# Patient Record
Sex: Female | Born: 1950 | Race: White | Hispanic: No | Marital: Married | Smoking: Former smoker
Health system: Southern US, Community
[De-identification: ages and names within clinical notes are randomized; demographics above are authoritative.]

## PROBLEM LIST (undated history)

## (undated) DIAGNOSIS — N921 Excessive and frequent menstruation with irregular cycle: Secondary | ICD-10-CM

## (undated) DIAGNOSIS — F32A Depression, unspecified: Secondary | ICD-10-CM

## (undated) DIAGNOSIS — I10 Essential (primary) hypertension: Secondary | ICD-10-CM

## (undated) DIAGNOSIS — N029 Recurrent and persistent hematuria with unspecified morphologic changes: Secondary | ICD-10-CM

## (undated) DIAGNOSIS — F329 Major depressive disorder, single episode, unspecified: Secondary | ICD-10-CM

## (undated) HISTORY — DX: Essential (primary) hypertension: I10

## (undated) HISTORY — DX: Excessive and frequent menstruation with irregular cycle: N92.1

## (undated) HISTORY — DX: Major depressive disorder, single episode, unspecified: F32.9

## (undated) HISTORY — DX: Recurrent and persistent hematuria with unspecified morphologic changes: N02.9

## (undated) HISTORY — DX: Depression, unspecified: F32.A

---

## 1995-12-30 HISTORY — PX: TUBAL LIGATION: SHX77

## 1996-01-30 HISTORY — PX: HYSTEROSCOPY: SHX211

## 1996-03-13 HISTORY — PX: TOTAL ABDOMINAL HYSTERECTOMY W/ BILATERAL SALPINGOOPHORECTOMY: SHX83

## 1997-07-26 ENCOUNTER — Ambulatory Visit (HOSPITAL_COMMUNITY): Admission: RE | Admit: 1997-07-26 | Discharge: 1997-07-26 | Payer: Self-pay | Admitting: Obstetrics and Gynecology

## 1997-07-29 DIAGNOSIS — N029 Recurrent and persistent hematuria with unspecified morphologic changes: Secondary | ICD-10-CM

## 1997-07-29 HISTORY — DX: Recurrent and persistent hematuria with unspecified morphologic changes: N02.9

## 1998-09-22 ENCOUNTER — Ambulatory Visit (HOSPITAL_COMMUNITY): Admission: RE | Admit: 1998-09-22 | Discharge: 1998-09-22 | Payer: Self-pay | Admitting: Obstetrics and Gynecology

## 1998-09-22 ENCOUNTER — Encounter: Payer: Self-pay | Admitting: Obstetrics and Gynecology

## 1999-11-11 ENCOUNTER — Ambulatory Visit (HOSPITAL_COMMUNITY): Admission: RE | Admit: 1999-11-11 | Discharge: 1999-11-11 | Payer: Self-pay | Admitting: Obstetrics and Gynecology

## 1999-11-11 ENCOUNTER — Encounter: Payer: Self-pay | Admitting: Obstetrics and Gynecology

## 2001-05-11 ENCOUNTER — Ambulatory Visit (HOSPITAL_COMMUNITY): Admission: RE | Admit: 2001-05-11 | Discharge: 2001-05-11 | Payer: Self-pay | Admitting: Obstetrics and Gynecology

## 2001-05-11 ENCOUNTER — Encounter: Payer: Self-pay | Admitting: Obstetrics and Gynecology

## 2001-11-30 ENCOUNTER — Other Ambulatory Visit: Admission: RE | Admit: 2001-11-30 | Discharge: 2001-11-30 | Payer: Self-pay | Admitting: *Deleted

## 2002-06-20 ENCOUNTER — Ambulatory Visit (HOSPITAL_COMMUNITY): Admission: RE | Admit: 2002-06-20 | Discharge: 2002-06-20 | Payer: Self-pay | Admitting: Obstetrics and Gynecology

## 2002-06-20 ENCOUNTER — Encounter: Payer: Self-pay | Admitting: Obstetrics and Gynecology

## 2002-12-11 ENCOUNTER — Other Ambulatory Visit: Admission: RE | Admit: 2002-12-11 | Discharge: 2002-12-11 | Payer: Self-pay | Admitting: Obstetrics and Gynecology

## 2003-06-26 ENCOUNTER — Ambulatory Visit (HOSPITAL_COMMUNITY): Admission: RE | Admit: 2003-06-26 | Discharge: 2003-06-26 | Payer: Self-pay | Admitting: Obstetrics and Gynecology

## 2004-04-14 ENCOUNTER — Ambulatory Visit (HOSPITAL_COMMUNITY): Admission: RE | Admit: 2004-04-14 | Discharge: 2004-04-14 | Payer: Self-pay | Admitting: Gastroenterology

## 2004-04-14 ENCOUNTER — Encounter (INDEPENDENT_AMBULATORY_CARE_PROVIDER_SITE_OTHER): Payer: Self-pay | Admitting: *Deleted

## 2004-08-04 ENCOUNTER — Ambulatory Visit (HOSPITAL_COMMUNITY): Admission: RE | Admit: 2004-08-04 | Discharge: 2004-08-04 | Payer: Self-pay | Admitting: Obstetrics and Gynecology

## 2005-09-15 ENCOUNTER — Ambulatory Visit (HOSPITAL_COMMUNITY): Admission: RE | Admit: 2005-09-15 | Discharge: 2005-09-15 | Payer: Self-pay | Admitting: Obstetrics and Gynecology

## 2006-09-19 ENCOUNTER — Ambulatory Visit (HOSPITAL_COMMUNITY): Admission: RE | Admit: 2006-09-19 | Discharge: 2006-09-19 | Payer: Self-pay | Admitting: Obstetrics & Gynecology

## 2007-09-20 ENCOUNTER — Ambulatory Visit (HOSPITAL_COMMUNITY): Admission: RE | Admit: 2007-09-20 | Discharge: 2007-09-20 | Payer: Self-pay | Admitting: Obstetrics and Gynecology

## 2008-09-24 ENCOUNTER — Ambulatory Visit (HOSPITAL_COMMUNITY): Admission: RE | Admit: 2008-09-24 | Discharge: 2008-09-24 | Payer: Self-pay | Admitting: Obstetrics & Gynecology

## 2009-09-25 ENCOUNTER — Ambulatory Visit (HOSPITAL_COMMUNITY): Admission: RE | Admit: 2009-09-25 | Discharge: 2009-09-25 | Payer: Self-pay | Admitting: Obstetrics and Gynecology

## 2010-09-17 ENCOUNTER — Other Ambulatory Visit (HOSPITAL_COMMUNITY): Payer: Self-pay | Admitting: Occupational Therapy

## 2010-09-17 DIAGNOSIS — Z1231 Encounter for screening mammogram for malignant neoplasm of breast: Secondary | ICD-10-CM

## 2010-09-29 ENCOUNTER — Ambulatory Visit (HOSPITAL_COMMUNITY)
Admission: RE | Admit: 2010-09-29 | Discharge: 2010-09-29 | Disposition: A | Discharge status: Home / Self Care | Payer: 59 | Source: Ambulatory Visit | Attending: Nurse Practitioner | Admitting: Nurse Practitioner

## 2010-09-29 DIAGNOSIS — Z1231 Encounter for screening mammogram for malignant neoplasm of breast: Secondary | ICD-10-CM

## 2010-10-09 ENCOUNTER — Other Ambulatory Visit (HOSPITAL_COMMUNITY): Payer: Self-pay | Admitting: Family Medicine

## 2010-10-09 ENCOUNTER — Other Ambulatory Visit (HOSPITAL_COMMUNITY): Payer: Self-pay | Admitting: Occupational Therapy

## 2010-10-09 ENCOUNTER — Other Ambulatory Visit: Payer: Self-pay | Admitting: Nurse Practitioner

## 2010-10-09 DIAGNOSIS — Z1231 Encounter for screening mammogram for malignant neoplasm of breast: Secondary | ICD-10-CM

## 2010-10-16 NOTE — Op Note (Signed)
Pam May, Pam May                 ACCOUNT NO.:  [id number]   MEDICAL RECORD NO.:  [id number]          PATIENT TYPE:  AMB   LOCATION:  ENDO                         FACILITY:  [facility]   PHYSICIAN:  Jordan Hawks. Elnoria Howard, MD    DATE OF BIRTH:  [birth year]   DATE OF PROCEDURE:  04/14/2004  DATE OF DISCHARGE:                                 OPERATIVE REPORT   REFERRING PHYSICIAN:  Dr. Myrlene Broker.   PROCEDURE PERFORMED:  Colonoscopy.   INDICATIONS FOR PROCEDURE:  Screening colonoscopy.   ENDOSCOPIST:  Jordan Hawks. Elnoria Howard, MD   INSTRUMENT USED:  Olympus adult colonoscope.   PHYSICAL EXAMINATION:  CARDIAC:  Regular rate and rhythm.  LUNGS:  Clear to auscultation bilaterally.  ABDOMEN:  Soft, nontender, nondistended.   MEDICATIONS GIVEN:  Versed 10 mg IV, Demerol 100 mg IV.   CONSENT:  Informed consent was obtained from the patient describing the  risks of bleeding, infection, perforation, medication reaction, a 10% miss  rate for a small colon cancer or polyp and the risk of death, all of which  are not exclusive of any other complications that can occur.   DESCRIPTION OF PROCEDURE:  After adequate sedation was achieved, a rectal  examination was performed which was negative for any palpable abnormalities.  The colonoscope was then inserted into the anus and advanced under direct  visualization to the terminal ileum without difficulty.  Photodocumentation  of the terminal ileum and the cecum was obtained.  Upon slow withdrawal of  the colonoscope, the patient was noted to have a very good prep.  There was  no evidence of any polyps, inflammation, ulcerations or erosions or vascular  abnormalities in the cecum, ascending, transverse or descending colon.  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 Additionally, in the sigmoid colon, two 5 mm polyps were  detected and removed with a cold snare without complication.  Upon  retroflexion in the rectum,  there was no evidence of any hemorrhoids or  other abnormalities.  The colonoscope was straightened and withdrawn from  the patient.  The procedure was terminated and the patient tolerated the  procedure well.  No complications were encountered.   PLAN:  1.  Follow-up on biopsies of the polyps.  2.  Repeat colonoscopy in five years.  3.  Maintain a high fiber diet.       PDH/MEDQ  D:  04/14/2004  T:  04/14/2004  Job:  [id number]

## 2011-09-15 ENCOUNTER — Other Ambulatory Visit: Payer: Self-pay | Admitting: Nurse Practitioner

## 2011-09-15 DIAGNOSIS — Z1231 Encounter for screening mammogram for malignant neoplasm of breast: Secondary | ICD-10-CM

## 2011-10-08 ENCOUNTER — Ambulatory Visit (HOSPITAL_COMMUNITY)
Admission: RE | Admit: 2011-10-08 | Discharge: 2011-10-08 | Disposition: A | Discharge status: Home / Self Care | Payer: 59 | Source: Ambulatory Visit | Attending: Nurse Practitioner | Admitting: Nurse Practitioner

## 2011-10-08 DIAGNOSIS — Z1231 Encounter for screening mammogram for malignant neoplasm of breast: Secondary | ICD-10-CM | POA: Insufficient documentation

## 2012-09-05 ENCOUNTER — Encounter: Payer: Self-pay | Admitting: Nurse Practitioner

## 2012-09-05 ENCOUNTER — Ambulatory Visit (INDEPENDENT_AMBULATORY_CARE_PROVIDER_SITE_OTHER): Payer: 59 | Admitting: Nurse Practitioner

## 2012-09-05 VITALS — BP 120/80 | HR 68 | Resp 18 | Ht 65.0 in | Wt 229.0 lb

## 2012-09-05 DIAGNOSIS — Z01419 Encounter for gynecological examination (general) (routine) without abnormal findings: Secondary | ICD-10-CM

## 2012-09-05 DIAGNOSIS — Z0000 Encounter for general adult medical examination without abnormal findings: Secondary | ICD-10-CM

## 2012-09-05 DIAGNOSIS — E559 Vitamin D deficiency, unspecified: Secondary | ICD-10-CM

## 2012-09-05 LAB — POCT URINALYSIS DIPSTICK
Blood, UA: NEGATIVE
Glucose, UA: NEGATIVE
Nitrite, UA: NEGATIVE
Protein, UA: NEGATIVE

## 2012-09-05 MED ORDER — HYDROCHLOROTHIAZIDE 25 MG PO TABS: 25.0000 mg | ORAL_TABLET | Freq: Every day | ORAL | Status: DC

## 2012-09-05 MED ORDER — ESTROGENS CONJUGATED 0.3 MG PO TABS: 0.3000 mg | ORAL_TABLET | Freq: Every day | ORAL | Status: DC

## 2012-09-05 MED ORDER — VITAMIN D (ERGOCALCIFEROL) 1.25 MG (50000 UNIT) PO CAPS: 50000.0000 [IU] | ORAL_CAPSULE | ORAL | Status: DC

## 2012-09-05 NOTE — Progress Notes (Signed)
62 y.o. G0P0000 Married Caucasian Fe here for annual exam.  No recent health issues. No vaso symptoms.  Some vaginal dryness and uses OTC lubrication.  She needs a refill on HCTZ until sees PCP this spring. (currently off med's).  No LMP recorded. Patient has had a hysterectomy.          Sexually active: yes  The current method of family planning is none.    Exercising: no  The patient does not participate in regular exercise at present. Smoker:  no  Health Maintenance: Pap:  12/11/02 MMG: 5/13 normal, to be scheduled after 10/07/12 Colonoscopy: 11/05 will be due in 2015 BMD:   04/09   normal TDaP: 05/17/08 Labs:Hgb, UA, Vitamin D   reports that she quit smoking about 16 years ago. She has never used smokeless tobacco. She reports that she drinks about 1.0 ounces of alcohol per week. She reports that she does not use illicit drugs.  Past Medical History  Diagnosis Date  . Hypertension   . Depression   . Menometrorrhagia     Past Surgical History  Procedure Laterality Date  . Abdominal hysterectomy  1992    Current Outpatient Prescriptions  Medication Sig Dispense Refill  . Calcium Carbonate-Vit D-Min (CALCIUM 1200 PO) Take by mouth.      . estrogens, conjugated, (PREMARIN) 0.45 MG tablet Take 0.45 mg by mouth daily. Take daily for 21 days then do not take for 7 days.      . hydrochlorothiazide (HYDRODIURIL) 25 MG tablet Take 25 mg by mouth daily.      . Multiple Vitamin (MULTIVITAMIN) capsule Take 1 capsule by mouth daily.      . Vitamin D, Ergocalciferol, (DRISDOL) 50000 UNITS CAPS Take 50,000 Units by mouth.       No current facility-administered medications for this visit.    Family History  Problem Relation Age of Onset  . Diabetes Father   . Hypertension Father     ROS:  Pertinent items are noted in HPI.  Otherwise, a comprehensive ROS was negative.  Exam:   BP 120/80  Pulse 68  Resp 18  Ht 5\' 5"  (1.651 m)  Wt 229 lb (103.874 kg)  BMI 38.11 kg/m2    Height:  5\' 5"  (165.1 cm)  Ht Readings from Last 3 Encounters:  09/05/12 5\' 5"  (1.651 m)    General appearance: alert, cooperative and appears stated age Head: Normocephalic, without obvious abnormality, atraumatic Neck: no adenopathy, supple, symmetrical, trachea midline and thyroid normal to inspection and palpation Lungs: clear to auscultation bilaterally Breasts: normal appearance, no masses or tenderness Heart: regular rate and rhythm Abdomen: soft, non-tender;  no masses,  no organomegaly Extremities: extremities normal, atraumatic, no cyanosis or edema Skin: Skin color, texture, turgor normal. No rashes or lesions Lymph nodes: Cervical, supraclavicular, and axillary nodes normal. No abnormal inguinal nodes palpated Neurologic: Grossly normal   Pelvic: External genitalia:  no lesions              Urethra:  normal appearing urethra with no masses, tenderness or lesions              Bartholin's and Skene's: normal                 Vagina: normal appearing vagina with atrophic color and no discharge, no lesions              Cervix: absent              Pap taken:  no Bimanual Exam:  Uterus:  uterus absent              Adnexa: no mass, fullness, tenderness               Rectovaginal: Confirms               Anus:  normal sphincter tone, no lesions  A:  Well Woman with normal exam  S/P TAH / BSO 10/97 secondary to menometrorrhagia  History of HTN  History of Vit D def  P:   Mammogram  Discussed OTC vit D vs RX - will depend on lab  results.  Return annually or prn  An After Visit Summary was printed and given to the patient.

## 2012-09-05 NOTE — Patient Instructions (Addendum)

## 2012-09-06 NOTE — Progress Notes (Signed)
Encounter reviewed by Dr. Brook Silva.  

## 2012-09-08 ENCOUNTER — Telehealth: Payer: Self-pay | Admitting: Nurse Practitioner

## 2012-09-08 NOTE — Telephone Encounter (Signed)
Pt is still waiting on someone to call her back with results

## 2012-09-08 NOTE — Telephone Encounter (Signed)
Results given 09/08/12 bp

## 2012-09-11 NOTE — Telephone Encounter (Signed)
Please call work [phone]

## 2012-09-14 ENCOUNTER — Encounter: Payer: Self-pay | Admitting: Nurse Practitioner

## 2012-09-21 NOTE — Telephone Encounter (Signed)
Patient called due to her frustration in delay getting a response from [place].  States she only sees Pam May each year so hs always had routine screening labs and is questioning what was different this year?  Why did we not check all the labs we have checked in past.  Advised that U/A and HGB are our  "routine" screening labs and that all other screening tests are ordered by the provider during the visit based on patient history and needs.  Her cholesterol has been excellent as far back as 2005.  Offered to allow patient to come back for additional screening labs and she declines but does want to make sure Pam May knows that she will want additional lab work next year.  Apologized for delay in responding to patient.

## 2012-11-01 ENCOUNTER — Other Ambulatory Visit: Payer: Self-pay | Admitting: Nurse Practitioner

## 2012-11-01 DIAGNOSIS — Z1231 Encounter for screening mammogram for malignant neoplasm of breast: Secondary | ICD-10-CM

## 2012-11-13 ENCOUNTER — Ambulatory Visit (HOSPITAL_COMMUNITY)
Admission: RE | Admit: 2012-11-13 | Discharge: 2012-11-13 | Disposition: A | Discharge status: Home / Self Care | Payer: 59 | Source: Ambulatory Visit | Attending: Nurse Practitioner | Admitting: Nurse Practitioner

## 2012-11-13 DIAGNOSIS — Z1231 Encounter for screening mammogram for malignant neoplasm of breast: Secondary | ICD-10-CM | POA: Insufficient documentation

## 2013-04-05 ENCOUNTER — Other Ambulatory Visit: Payer: Self-pay

## 2013-10-24 ENCOUNTER — Other Ambulatory Visit: Payer: Self-pay | Admitting: Nurse Practitioner

## 2013-10-24 DIAGNOSIS — Z1231 Encounter for screening mammogram for malignant neoplasm of breast: Secondary | ICD-10-CM

## 2013-11-14 ENCOUNTER — Ambulatory Visit (HOSPITAL_COMMUNITY)
Admission: RE | Admit: 2013-11-14 | Discharge: 2013-11-14 | Disposition: A | Discharge status: Home / Self Care | Payer: 59 | Source: Ambulatory Visit | Attending: Nurse Practitioner | Admitting: Nurse Practitioner

## 2013-11-14 DIAGNOSIS — Z1231 Encounter for screening mammogram for malignant neoplasm of breast: Secondary | ICD-10-CM

## 2014-04-01 ENCOUNTER — Encounter: Payer: Self-pay | Admitting: Nurse Practitioner

## 2014-11-04 ENCOUNTER — Other Ambulatory Visit: Payer: Self-pay | Admitting: Nurse Practitioner

## 2014-11-04 DIAGNOSIS — Z1231 Encounter for screening mammogram for malignant neoplasm of breast: Secondary | ICD-10-CM

## 2014-11-18 ENCOUNTER — Other Ambulatory Visit: Payer: Self-pay | Admitting: Nurse Practitioner

## 2014-11-18 ENCOUNTER — Ambulatory Visit (HOSPITAL_COMMUNITY)
Admission: RE | Admit: 2014-11-18 | Discharge: 2014-11-18 | Disposition: A | Discharge status: Home / Self Care | Payer: BLUE CROSS/BLUE SHIELD | Source: Ambulatory Visit | Attending: Nurse Practitioner | Admitting: Nurse Practitioner

## 2014-11-18 DIAGNOSIS — R928 Other abnormal and inconclusive findings on diagnostic imaging of breast: Secondary | ICD-10-CM

## 2014-11-18 DIAGNOSIS — Z1231 Encounter for screening mammogram for malignant neoplasm of breast: Secondary | ICD-10-CM | POA: Diagnosis not present

## 2014-11-21 ENCOUNTER — Ambulatory Visit
Admission: RE | Admit: 2014-11-21 | Discharge: 2014-11-21 | Disposition: A | Discharge status: Home / Self Care | Payer: BLUE CROSS/BLUE SHIELD | Source: Ambulatory Visit | Attending: Nurse Practitioner | Admitting: Nurse Practitioner

## 2014-11-21 DIAGNOSIS — R928 Other abnormal and inconclusive findings on diagnostic imaging of breast: Secondary | ICD-10-CM

## 2014-11-27 ENCOUNTER — Telehealth: Payer: Self-pay | Admitting: Emergency Medicine

## 2014-11-27 NOTE — Telephone Encounter (Signed)
Message left to return call to [place] at [phone].   To schedule annual exam with [name], FNP.

## 2014-11-27 NOTE — Telephone Encounter (Signed)
-----   Message from Ria CommentPatricia Grubb, FNP sent at 11/21/2014  6:15 PM EDT ----- May take out of hold after MD review.  She needs AEX anyway and can repeat breast exam at that time.

## 2014-11-28 NOTE — Telephone Encounter (Signed)
Called and spoke with patient. She is agreeable to scheduling annual exam and breast check.  Scheduled for 12/04/14. ,Routing to provider for final review. Patient agreeable to disposition. Will close encounter.

## 2014-11-28 NOTE — Telephone Encounter (Signed)
Patient returning call.

## 2014-12-04 ENCOUNTER — Encounter: Payer: Self-pay | Admitting: Nurse Practitioner

## 2014-12-04 ENCOUNTER — Ambulatory Visit (INDEPENDENT_AMBULATORY_CARE_PROVIDER_SITE_OTHER): Payer: BLUE CROSS/BLUE SHIELD | Admitting: Nurse Practitioner

## 2014-12-04 VITALS — BP 140/82 | HR 80 | Resp 16 | Ht 64.75 in | Wt 222.0 lb

## 2014-12-04 DIAGNOSIS — I10 Essential (primary) hypertension: Secondary | ICD-10-CM | POA: Insufficient documentation

## 2014-12-04 DIAGNOSIS — Z0000 Encounter for general adult medical examination without abnormal findings: Secondary | ICD-10-CM | POA: Diagnosis not present

## 2014-12-04 DIAGNOSIS — Z79890 Hormone replacement therapy (postmenopausal): Secondary | ICD-10-CM | POA: Diagnosis not present

## 2014-12-04 DIAGNOSIS — Z90710 Acquired absence of both cervix and uterus: Secondary | ICD-10-CM | POA: Diagnosis not present

## 2014-12-04 DIAGNOSIS — E559 Vitamin D deficiency, unspecified: Secondary | ICD-10-CM | POA: Insufficient documentation

## 2014-12-04 DIAGNOSIS — Z01419 Encounter for gynecological examination (general) (routine) without abnormal findings: Secondary | ICD-10-CM | POA: Diagnosis not present

## 2014-12-04 DIAGNOSIS — Z1211 Encounter for screening for malignant neoplasm of colon: Secondary | ICD-10-CM | POA: Diagnosis not present

## 2014-12-04 DIAGNOSIS — Z9079 Acquired absence of other genital organ(s): Secondary | ICD-10-CM | POA: Diagnosis not present

## 2014-12-04 DIAGNOSIS — Z90722 Acquired absence of ovaries, bilateral: Secondary | ICD-10-CM

## 2014-12-04 LAB — POCT URINALYSIS DIPSTICK
BILIRUBIN UA: NEGATIVE
GLUCOSE UA: NEGATIVE
Ketones, UA: NEGATIVE
Leukocytes, UA: NEGATIVE
Nitrite, UA: NEGATIVE
PROTEIN UA: NEGATIVE
Urobilinogen, UA: NEGATIVE
pH, UA: 5

## 2014-12-04 MED ORDER — HYDROCHLOROTHIAZIDE 12.5 MG PO TABS: 12.5000 mg | ORAL_TABLET | Freq: Every day | ORAL | Status: DC

## 2014-12-04 NOTE — Progress Notes (Signed)
64 y.o. G110 Married  Caucasian Fe here for annual exam.  Came off ERT in March 2015 and has done well.  She ran out of HCTZ since the fall but felt that since she had retired the BP would come down.  She and husband want to go and travel more.  She did try to get back to see PCP and since over 3 years they would no longer see her.  New PCP in [place].   Patient's last menstrual period was 03/13/1996.          Sexually active: Yes.    The current method of family planning is status post hysterectomy.    Exercising: Yes.    Walking daily 2 miles Smoker:  no  Health Maintenance: Pap:  11/2002  MMG: 11/18/14 BIRADS0:Incomplete. 11/21/14 US Left BIRADS2:Benign and repeat in 1 year Colonoscopy: 03/2004 1 polyp and recheck in 10 years BMD:   2009 normal  TDaP:  2009 Labs: Here today- Vit D check UA: RBC=Trace - Benign Hematuria  Hg: 15.2   reports that she quit smoking about 18 years ago. She has never used smokeless tobacco. She reports that she drinks about 1.0 oz of alcohol per week. She reports that she does not use illicit drugs.  Past Medical History  Diagnosis Date  . Hypertension   . Depression   . Menometrorrhagia   . Benign hematuria 3/99    Dr. Phoebe Perchtelin    Past Surgical History  Procedure Laterality Date  . Tubal ligation  8/97  . Hysteroscopy  9/97    polyp, atypical complex hyperplasia  . Abdominal hysterectomy  03/13/1996    and BSO    Current Outpatient Prescriptions  Medication Sig Dispense Refill  . Calcium Carbonate-Vit D-Min (CALCIUM 1200 PO) Take by mouth.    . Multiple Vitamin (MULTIVITAMIN) tablet Take 1 tablet by mouth daily.    . hydrochlorothiazide (HYDRODIURIL) 12.5 MG tablet Take 1 tablet (12.5 mg total) by mouth daily. 90 tablet 3   No current facility-administered medications for this visit.    Family History  Problem Relation Age of Onset  . Diabetes Father   . Hypertension Father   . Heart disease Father   . Cancer Mother [id number]    died of tumor  in chest   . Lung cancer Father [id number]    ROS:  Pertinent items are noted in HPI.  Otherwise, a comprehensive ROS was negative.  Exam:   BP 140/82 mmHg  Pulse 80  Resp 16  Ht 5' 4.75" (1.645 m)  Wt 222 lb (100.699 kg)  BMI 37.21 kg/m2  LMP 03/13/1996 Height: 5' 4.75" (164.5 cm) Ht Readings from Last 3 Encounters:  12/04/14 5' 4.75" (1.645 m)  09/05/12 5\' 5"  (1.651 m)    General appearance: alert, cooperative and appears stated age Head: Normocephalic, without obvious abnormality, atraumatic Neck: no adenopathy, supple, symmetrical, trachea midline and thyroid normal to inspection and palpation Lungs: clear to auscultation bilaterally Breasts: normal appearance, no masses or tenderness Heart: regular rate and rhythm Abdomen: soft, non-tender; no masses,  no organomegaly Extremities: extremities normal, atraumatic, no cyanosis or edema Skin: Skin color, texture, turgor normal. No rashes or lesions Lymph nodes: Cervical, supraclavicular, and axillary nodes normal. No abnormal inguinal nodes palpated Neurologic: Grossly normal   Pelvic: External genitalia:  no lesions              Urethra:  normal appearing urethra with no masses, tenderness or lesions  Bartholin's and Skene's: normal                 Vagina: atrophic appearing vagina with normal color and discharge, no lesions              Cervix: absent              Pap taken: No. 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

## 2014-12-04 NOTE — Patient Instructions (Signed)

## 2014-12-05 LAB — CBC WITH DIFFERENTIAL/PLATELET
BASOS ABS: 0.1 10*3/uL (ref 0.0–0.1)
BASOS PCT: 1 % (ref 0–1)
EOS PCT: 2 % (ref 0–5)
Eosinophils Absolute: 0.2 10*3/uL (ref 0.0–0.7)
HCT: 47.4 % — ABNORMAL HIGH (ref 36.0–46.0)
Hemoglobin: 15.7 g/dL — ABNORMAL HIGH (ref 12.0–15.0)
Lymphocytes Relative: 32 % (ref 12–46)
Lymphs Abs: 2.8 10*3/uL (ref 0.7–4.0)
MCH: 29 pg (ref 26.0–34.0)
MCHC: 33.1 g/dL (ref 30.0–36.0)
MCV: 87.6 fL (ref 78.0–100.0)
MONO ABS: 0.4 10*3/uL (ref 0.1–1.0)
MPV: 10.3 fL (ref 8.6–12.4)
Monocytes Relative: 5 % (ref 3–12)
NEUTROS ABS: 5.2 10*3/uL (ref 1.7–7.7)
Neutrophils Relative %: 60 % (ref 43–77)
PLATELETS: 196 10*3/uL (ref 150–400)
RBC: 5.41 MIL/uL — ABNORMAL HIGH (ref 3.87–5.11)
RDW: 13.8 % (ref 11.5–15.5)
WBC: 8.6 10*3/uL (ref 4.0–10.5)

## 2014-12-05 LAB — COMPREHENSIVE METABOLIC PANEL
ALT: 29 U/L (ref 0–35)
AST: 27 U/L (ref 0–37)
Albumin: 4 g/dL (ref 3.5–5.2)
Alkaline Phosphatase: 100 U/L (ref 39–117)
BUN: 15 mg/dL (ref 6–23)
CO2: 27 mEq/L (ref 19–32)
CREATININE: 0.78 mg/dL (ref 0.50–1.10)
Calcium: 9.4 mg/dL (ref 8.4–10.5)
Chloride: 104 mEq/L (ref 96–112)
GLUCOSE: 89 mg/dL (ref 70–99)
Potassium: 4.7 mEq/L (ref 3.5–5.3)
Sodium: 141 mEq/L (ref 135–145)
Total Bilirubin: 0.8 mg/dL (ref 0.2–1.2)
Total Protein: 6.9 g/dL (ref 6.0–8.3)

## 2014-12-05 LAB — LIPID PANEL
CHOL/HDL RATIO: 4.2 ratio
Cholesterol: 183 mg/dL (ref 0–200)
HDL: 44 mg/dL — ABNORMAL LOW (ref >=46)
LDL CALC: 116 mg/dL — AB (ref 0–99)
Triglycerides: 116 mg/dL (ref <150)
VLDL: 23 mg/dL (ref 0–40)

## 2014-12-05 LAB — TSH: TSH: 2.551 u[IU]/mL (ref 0.350–4.500)

## 2014-12-05 LAB — VITAMIN D 25 HYDROXY (VIT D DEFICIENCY, FRACTURES): VIT D 25 HYDROXY: 24 ng/mL — AB (ref 30–100)

## 2014-12-09 LAB — HEMOGLOBIN, FINGERSTICK: Hemoglobin, fingerstick: 15.2 g/dL (ref 12.0–16.0)

## 2014-12-09 NOTE — Progress Notes (Signed)
Encounter reviewed by Dr. Jatavious Peppard Amundson C. Silva.  

## 2014-12-17 ENCOUNTER — Telehealth: Payer: Self-pay | Admitting: Nurse Practitioner

## 2014-12-17 NOTE — Telephone Encounter (Signed)
Patient returning a call but there is no open phone note. She thinks it may have been about results.

## 2014-12-17 NOTE — Telephone Encounter (Signed)
Notes Recorded by Dion Bodyeina C Beltran, CMA on 12/10/2014 at 8:50 AM [facility] Notes Recorded by Ria CommentPatricia Grubb, FNP on 12/06/2014 at 7:57 AM Results via my chart:   Myriam JacobsonHelen, forgot to mention but the Thyroid was normal as well. Notes Recorded by Ria CommentPatricia Grubb, FNP on 12/06/2014 at 7:55 AM Results via my chart:  Myriam JacobsonHelen, The Vit D test was down again but you may take OTC Vit D 1000 IU daily and that will help. The Lipid panel shows a normal total cholesterol but an elevated LDL (bad cholesterol). This can come down with diet. The kidney, liver, glucose is normal. The CBC is normal except for a higher red blood cell counts. Be sure to share labs with your PCP.

## 2014-12-17 NOTE — Telephone Encounter (Signed)
Spoke with patient. Advised of results as seen below from [name], FNP. Patient is agreeable and verbalizes understanding. Patient states that her PCP is Pam GemmaKristen Kaplan, PA with [facility]. Requests these results be sent to her office for review. Advised will fax results. Patient is agreeable. Results faxed with cover sheet to [phone] confirmation received.   Routing to provider for final review. Patient agreeable to disposition. Will close encounter.

## 2015-10-24 ENCOUNTER — Other Ambulatory Visit: Payer: Self-pay

## 2015-10-24 DIAGNOSIS — Z1231 Encounter for screening mammogram for malignant neoplasm of breast: Secondary | ICD-10-CM

## 2015-11-18 ENCOUNTER — Other Ambulatory Visit: Payer: Self-pay | Admitting: Nurse Practitioner

## 2015-11-18 NOTE — Telephone Encounter (Signed)
Medication refill request: HCTZ Last AEX:  12-17-14 Next AEX: 12-16-16 Last MMG (if hormonal medication request): 11-21-14 WNL  Refill authorized: please advise

## 2015-11-25 ENCOUNTER — Ambulatory Visit
Admission: RE | Admit: 2015-11-25 | Discharge: 2015-11-25 | Disposition: A | Discharge status: Home / Self Care | Payer: BLUE CROSS/BLUE SHIELD | Source: Ambulatory Visit

## 2015-11-25 DIAGNOSIS — Z1231 Encounter for screening mammogram for malignant neoplasm of breast: Secondary | ICD-10-CM

## 2015-12-10 ENCOUNTER — Ambulatory Visit (INDEPENDENT_AMBULATORY_CARE_PROVIDER_SITE_OTHER): Payer: BLUE CROSS/BLUE SHIELD | Admitting: Podiatry

## 2015-12-10 ENCOUNTER — Encounter: Payer: Self-pay | Admitting: Podiatry

## 2015-12-10 ENCOUNTER — Ambulatory Visit (INDEPENDENT_AMBULATORY_CARE_PROVIDER_SITE_OTHER): Payer: BLUE CROSS/BLUE SHIELD

## 2015-12-10 VITALS — BP 153/93 | HR 70 | Resp 16 | Ht 65.5 in

## 2015-12-10 DIAGNOSIS — M779 Enthesopathy, unspecified: Secondary | ICD-10-CM | POA: Diagnosis not present

## 2015-12-10 DIAGNOSIS — M79671 Pain in right foot: Secondary | ICD-10-CM

## 2015-12-10 DIAGNOSIS — M79672 Pain in left foot: Secondary | ICD-10-CM

## 2015-12-10 DIAGNOSIS — M21619 Bunion of unspecified foot: Secondary | ICD-10-CM | POA: Diagnosis not present

## 2015-12-10 MED ORDER — TRIAMCINOLONE ACETONIDE 10 MG/ML IJ SUSP
10.0000 mg | Freq: Once | INTRAMUSCULAR | Status: AC
  Administered 2015-12-10: 10 mg

## 2015-12-10 NOTE — Progress Notes (Signed)
   Subjective:    Patient ID: Pam May, female    DOB: January 12, 1951, 65 y.o.   MRN: [id number]  HPI Chief Complaint  Patient presents with  . Foot Pain    Right foot-lateral side; x1 month      Review of Systems  All other systems reviewed and are negative.      Objective:   Physical Exam        Assessment & Plan:

## 2015-12-10 NOTE — Progress Notes (Signed)
Subjective:     Patient ID: Pam May, female   DOB: 1951-04-01, 65 y.o.   MRN: [id number]  HPI patient presents with pain in the outside of the right foot and states that it's been very tender and making it hard to walk and she had this about 4 years ago   Review of Systems  All other systems reviewed and are negative.      Objective:   Physical Exam  Constitutional: She is oriented to person, place, and time.  Cardiovascular: Intact distal pulses.   Musculoskeletal: Normal range of motion.  Neurological: She is oriented to person, place, and time.  Skin: Skin is warm.  Nursing note and vitals reviewed.  neurovascular status intact muscle strength adequate range of motion within normal limits with inflammation and pain around the lateral side fifth metatarsal base with fluid buildup but no indication of tendon dysfunction. Patient's found have good digital perfusion and is well oriented 3     Assessment:     Inflammatory tendinitis peroneal insertion right    Plan:     H&P condition reviewed and careful sheath injection administered right peroneal tendon insertion. Applied fascial brace to reduce pressure and reappoint to recheck

## 2015-12-17 ENCOUNTER — Ambulatory Visit: Payer: BLUE CROSS/BLUE SHIELD | Admitting: Nurse Practitioner

## 2016-11-01 ENCOUNTER — Other Ambulatory Visit: Payer: Self-pay | Admitting: Family Medicine

## 2016-11-01 ENCOUNTER — Other Ambulatory Visit: Payer: Self-pay | Admitting: Nurse Practitioner

## 2016-11-01 DIAGNOSIS — Z1231 Encounter for screening mammogram for malignant neoplasm of breast: Secondary | ICD-10-CM

## 2016-11-25 ENCOUNTER — Ambulatory Visit
Admission: RE | Admit: 2016-11-25 | Discharge: 2016-11-25 | Disposition: A | Discharge status: Home / Self Care | Payer: Medicare Other | Source: Ambulatory Visit | Attending: Family Medicine | Admitting: Family Medicine

## 2016-11-25 DIAGNOSIS — Z1231 Encounter for screening mammogram for malignant neoplasm of breast: Secondary | ICD-10-CM

## 2017-04-18 ENCOUNTER — Ambulatory Visit (INDEPENDENT_AMBULATORY_CARE_PROVIDER_SITE_OTHER): Payer: Medicare Other | Admitting: Podiatry

## 2017-04-18 ENCOUNTER — Encounter: Payer: Self-pay | Admitting: Podiatry

## 2017-04-18 DIAGNOSIS — M21619 Bunion of unspecified foot: Secondary | ICD-10-CM | POA: Diagnosis not present

## 2017-04-18 DIAGNOSIS — L600 Ingrowing nail: Secondary | ICD-10-CM | POA: Diagnosis not present

## 2017-04-18 NOTE — Progress Notes (Signed)
Subjective:    Patient ID: Pam FillerHelen A May, female   DOB: 66 y.o.   MRN: [id number]   HPI patient presents with some thickness and discoloration of the left second nail and is concerned about fungus or future progression    Review of Systems  All other systems reviewed and are negative.       Objective:  Physical Exam  Cardiovascular: Intact distal pulses.  Musculoskeletal: Normal range of motion.  Neurological: She is alert.  Skin: Skin is warm.  Nursing note and vitals reviewed.  neurovascular status intact muscle strength was adequate range of motion within normal limits with patient noted to have discoloration second nail left with thickness. Patient does not smoke and is active. Patient states that it is minimally discomforting and she wears a pad when she wears tighter shoes     Assessment:   Traumatized left second nail with thickness discoloration and possibility that there May be fungal element to the toe with pain due to the thickness      Plan:   H&P and condition reviewed. At this point I have recommended cushioning for this and I applied a silicone pad to take pressure off the toe. Patient will be seen back for us to recheck again and at this time we will just watch it and the decision on nail removal will be made based on progression

## 2017-10-31 ENCOUNTER — Other Ambulatory Visit: Payer: Self-pay | Admitting: Family Medicine

## 2017-10-31 DIAGNOSIS — Z1231 Encounter for screening mammogram for malignant neoplasm of breast: Secondary | ICD-10-CM

## 2017-11-28 ENCOUNTER — Ambulatory Visit: Payer: Medicare Other

## 2017-11-28 ENCOUNTER — Ambulatory Visit
Admission: RE | Admit: 2017-11-28 | Discharge: 2017-11-28 | Disposition: A | Discharge status: Home / Self Care | Payer: Medicare Other | Source: Ambulatory Visit | Attending: Family Medicine | Admitting: Family Medicine

## 2017-11-28 DIAGNOSIS — Z1231 Encounter for screening mammogram for malignant neoplasm of breast: Secondary | ICD-10-CM

## 2018-10-31 ENCOUNTER — Other Ambulatory Visit: Payer: Self-pay | Admitting: Family Medicine

## 2018-10-31 DIAGNOSIS — Z1231 Encounter for screening mammogram for malignant neoplasm of breast: Secondary | ICD-10-CM

## 2018-12-15 ENCOUNTER — Ambulatory Visit
Admission: RE | Admit: 2018-12-15 | Discharge: 2018-12-15 | Disposition: A | Discharge status: Home / Self Care | Payer: Medicare Other | Source: Ambulatory Visit | Attending: Family Medicine | Admitting: Family Medicine

## 2018-12-15 ENCOUNTER — Other Ambulatory Visit: Payer: Self-pay

## 2018-12-15 DIAGNOSIS — Z1231 Encounter for screening mammogram for malignant neoplasm of breast: Secondary | ICD-10-CM

## 2019-11-14 ENCOUNTER — Other Ambulatory Visit: Payer: Self-pay | Admitting: Family Medicine

## 2019-11-14 DIAGNOSIS — Z1231 Encounter for screening mammogram for malignant neoplasm of breast: Secondary | ICD-10-CM

## 2019-12-17 ENCOUNTER — Ambulatory Visit
Admission: RE | Admit: 2019-12-17 | Discharge: 2019-12-17 | Disposition: A | Discharge status: Home / Self Care | Payer: Medicare Other | Source: Ambulatory Visit | Attending: Family Medicine | Admitting: Family Medicine

## 2019-12-17 ENCOUNTER — Other Ambulatory Visit: Payer: Self-pay

## 2019-12-17 DIAGNOSIS — Z1231 Encounter for screening mammogram for malignant neoplasm of breast: Secondary | ICD-10-CM

## 2020-03-06 ENCOUNTER — Other Ambulatory Visit: Payer: Self-pay | Admitting: Family Medicine

## 2020-03-06 DIAGNOSIS — E2839 Other primary ovarian failure: Secondary | ICD-10-CM

## 2020-06-18 ENCOUNTER — Other Ambulatory Visit: Payer: Medicare Other

## 2020-10-13 ENCOUNTER — Ambulatory Visit
Admission: RE | Admit: 2020-10-13 | Discharge: 2020-10-13 | Disposition: A | Discharge status: Home / Self Care | Payer: Medicare Other | Source: Ambulatory Visit | Attending: Family Medicine | Admitting: Family Medicine

## 2020-10-13 ENCOUNTER — Other Ambulatory Visit: Payer: Self-pay

## 2020-10-13 DIAGNOSIS — E2839 Other primary ovarian failure: Secondary | ICD-10-CM

## 2020-12-23 ENCOUNTER — Other Ambulatory Visit: Payer: Self-pay | Admitting: Family Medicine

## 2020-12-23 DIAGNOSIS — Z1231 Encounter for screening mammogram for malignant neoplasm of breast: Secondary | ICD-10-CM

## 2020-12-29 ENCOUNTER — Ambulatory Visit
Admission: RE | Admit: 2020-12-29 | Discharge: 2020-12-29 | Disposition: A | Discharge status: Home / Self Care | Payer: Medicare Other | Source: Ambulatory Visit | Attending: Family Medicine | Admitting: Family Medicine

## 2020-12-29 ENCOUNTER — Other Ambulatory Visit: Payer: Self-pay

## 2020-12-29 DIAGNOSIS — Z1231 Encounter for screening mammogram for malignant neoplasm of breast: Secondary | ICD-10-CM

## 2021-09-15 IMAGING — MG MM DIGITAL SCREENING BILAT W/ TOMO AND CAD
8 series · 8 of 24 positions shown · non-contrast
Comparison: Previous exam(s).

CLINICAL DATA: Screening.

EXAM:
DIGITAL SCREENING BILATERAL MAMMOGRAM WITH TOMOSYNTHESIS AND CAD
TECHNIQUE: Bilateral screening digital craniocaudal and mediolateral oblique
mammograms were obtained. Bilateral screening digital breast
tomosynthesis was performed. The images were evaluated with
computer-aided detection.

[R CC synth-2D]
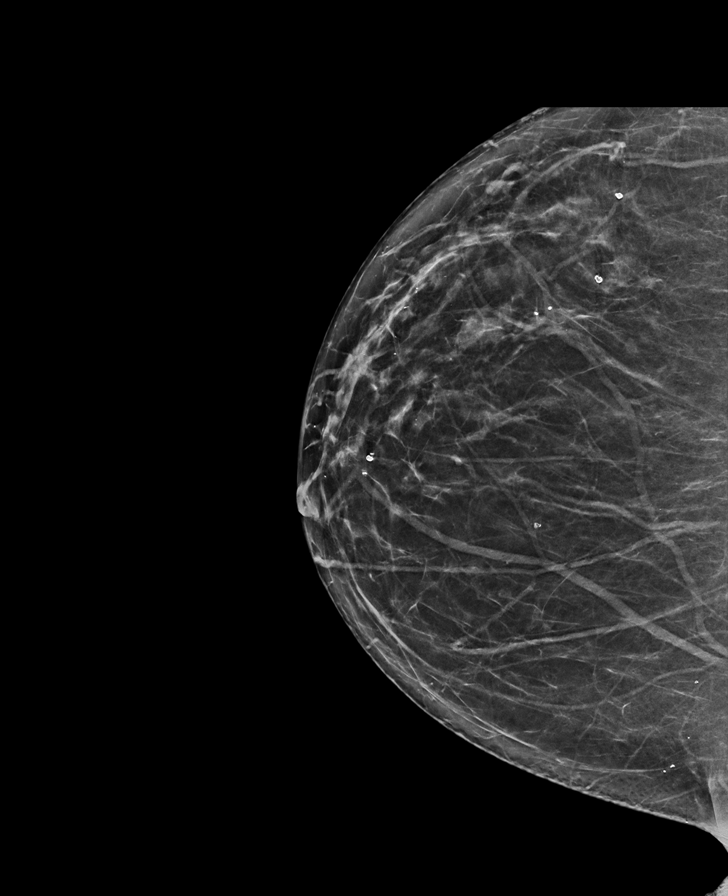

[R MLO synth-2D]
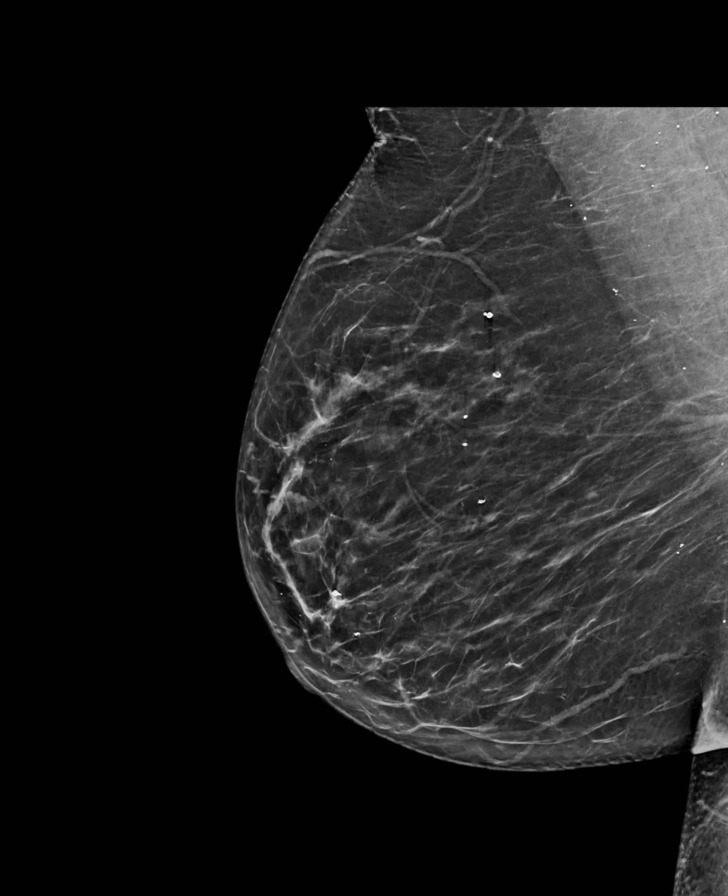

[L CC synth-2D]
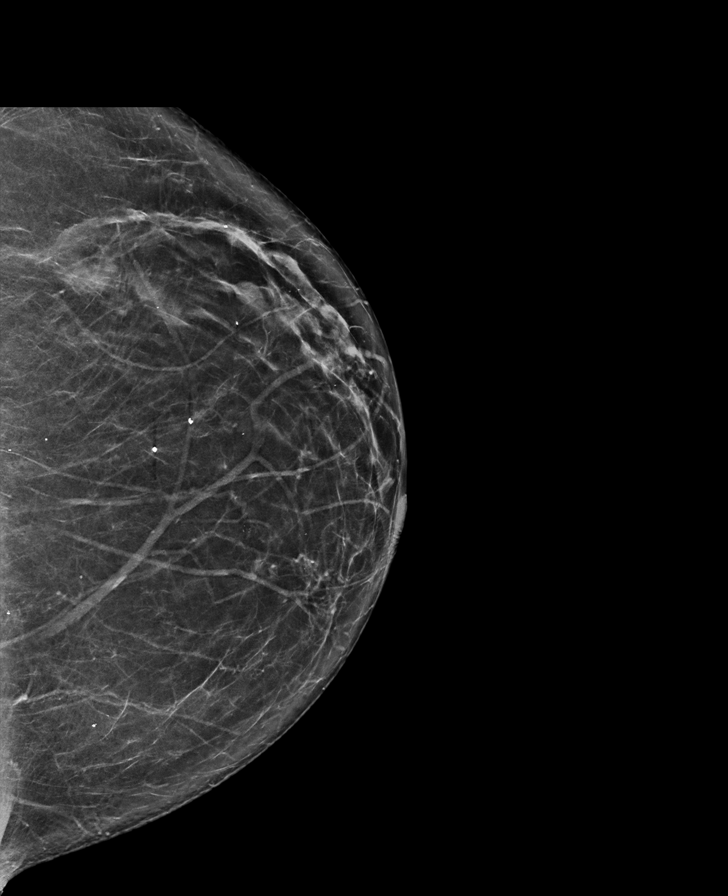

[L MLO synth-2D]
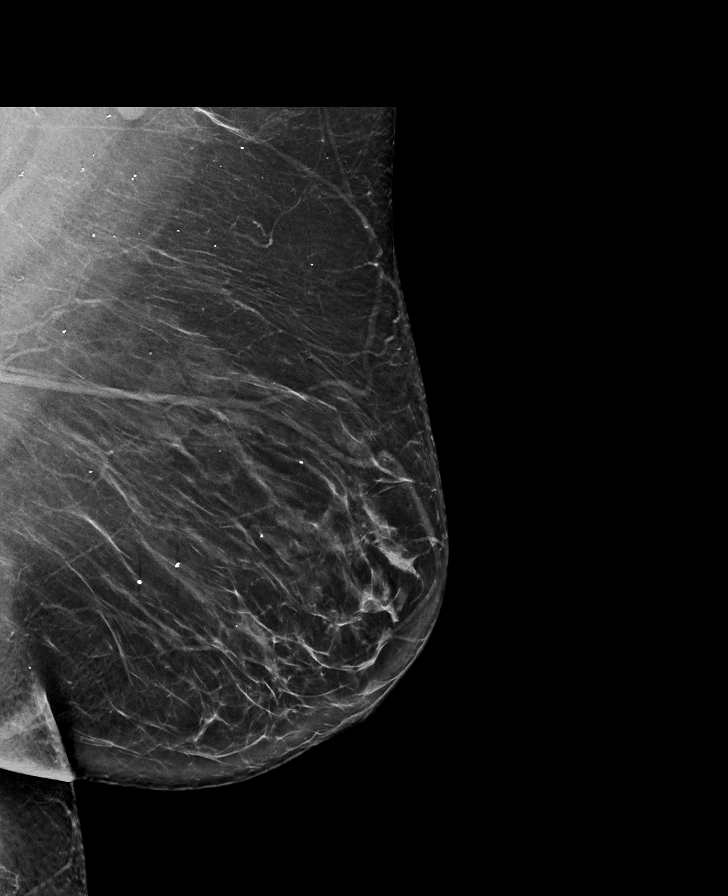

[L MLO tomo · tomo slice 43/85.0]
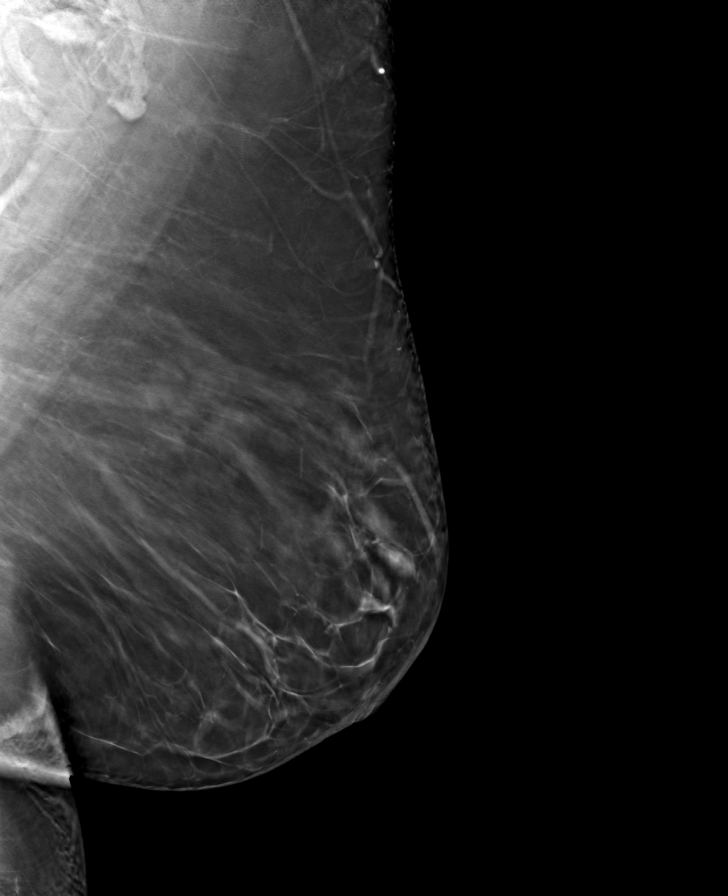

[R CC tomo · tomo slice 35/68.0]
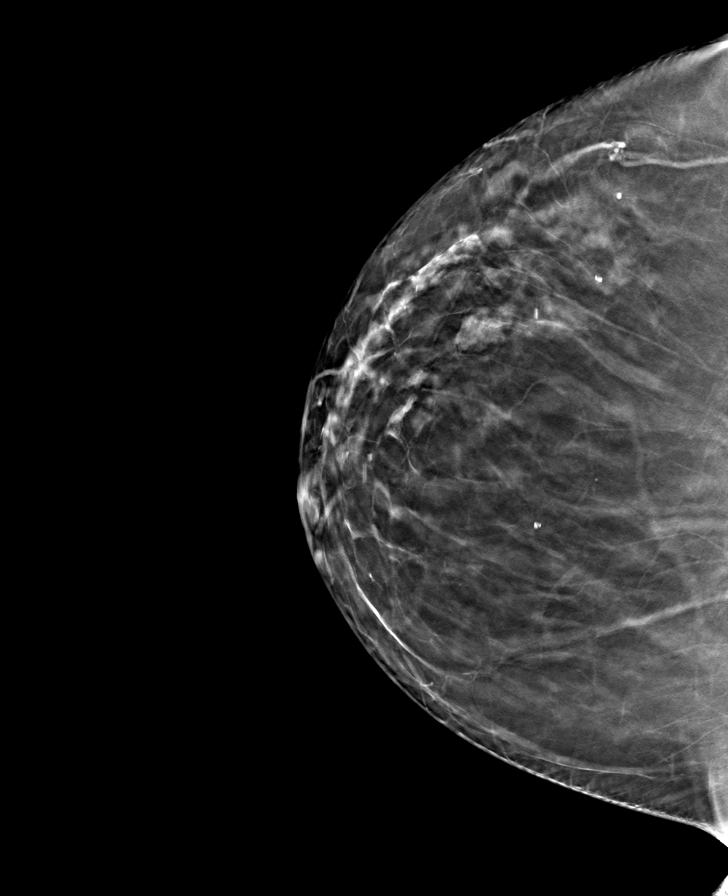

[R MLO tomo · tomo slice 39/76.0]
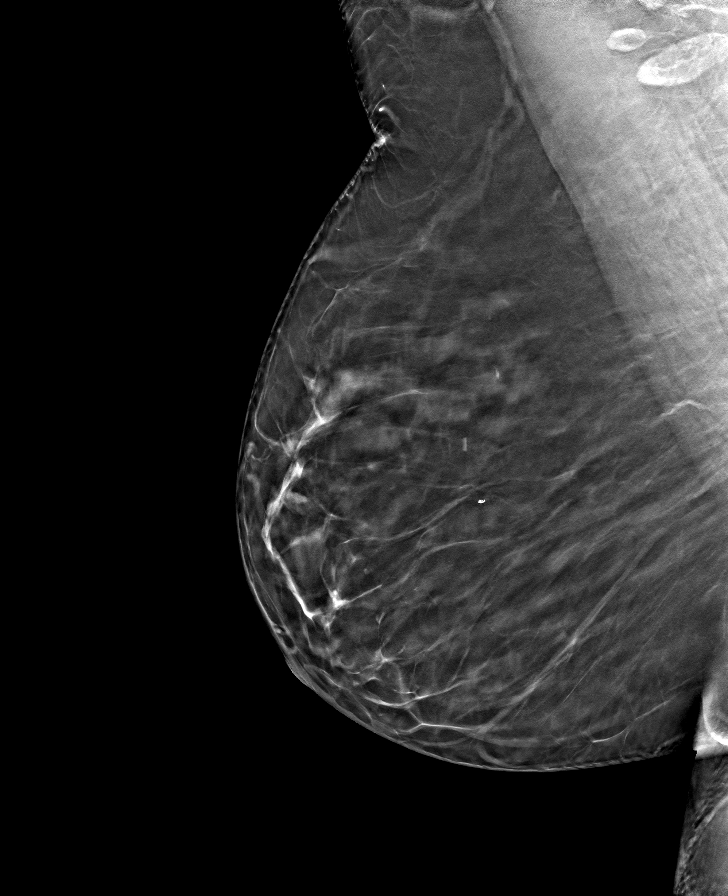

[L CC tomo · tomo slice 35/68.0]
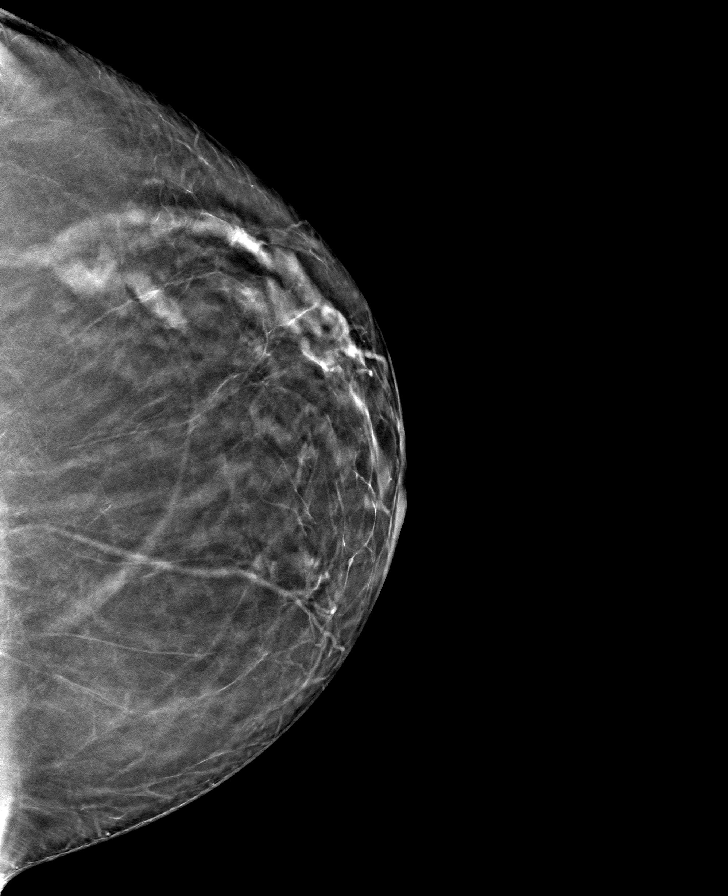

[8 of 24 positions shown; findings below may reference images not displayed]

ACR Breast Density Category b: There are scattered areas of
fibroglandular density.
FINDINGS: There are no findings suspicious for malignancy.
IMPRESSION: No mammographic evidence of malignancy. A result letter of this
screening mammogram will be mailed directly to the patient.

RECOMMENDATION:
Screening mammogram in one year. (Code:51-O-LD2)

BI-RADS CATEGORY  1: Negative.

## 2021-12-14 ENCOUNTER — Other Ambulatory Visit: Payer: Self-pay | Admitting: Family Medicine

## 2021-12-14 DIAGNOSIS — Z1231 Encounter for screening mammogram for malignant neoplasm of breast: Secondary | ICD-10-CM

## 2022-01-06 ENCOUNTER — Ambulatory Visit
Admission: RE | Admit: 2022-01-06 | Discharge: 2022-01-06 | Disposition: A | Discharge status: Home / Self Care | Payer: Medicare Other | Source: Ambulatory Visit | Attending: Family Medicine | Admitting: Family Medicine

## 2022-01-06 DIAGNOSIS — Z1231 Encounter for screening mammogram for malignant neoplasm of breast: Secondary | ICD-10-CM

## 2022-12-07 ENCOUNTER — Other Ambulatory Visit: Payer: Self-pay | Admitting: Family Medicine

## 2022-12-07 DIAGNOSIS — Z1231 Encounter for screening mammogram for malignant neoplasm of breast: Secondary | ICD-10-CM

## 2023-01-11 ENCOUNTER — Ambulatory Visit
Admission: RE | Admit: 2023-01-11 | Discharge: 2023-01-11 | Disposition: A | Discharge status: Home / Self Care | Payer: Medicare Other | Source: Ambulatory Visit | Attending: Family Medicine | Admitting: Family Medicine

## 2023-01-11 DIAGNOSIS — Z1231 Encounter for screening mammogram for malignant neoplasm of breast: Secondary | ICD-10-CM

## 2023-03-28 ENCOUNTER — Other Ambulatory Visit: Payer: Self-pay | Admitting: Family Medicine

## 2023-03-28 DIAGNOSIS — E2839 Other primary ovarian failure: Secondary | ICD-10-CM

## 2023-11-04 ENCOUNTER — Other Ambulatory Visit (HOSPITAL_BASED_OUTPATIENT_CLINIC_OR_DEPARTMENT_OTHER): Payer: Self-pay | Admitting: Family Medicine

## 2023-11-04 DIAGNOSIS — E2839 Other primary ovarian failure: Secondary | ICD-10-CM

## 2023-11-29 ENCOUNTER — Other Ambulatory Visit: Payer: Medicare Other

## 2023-12-06 ENCOUNTER — Other Ambulatory Visit: Payer: Self-pay | Admitting: Family Medicine

## 2023-12-06 DIAGNOSIS — Z1231 Encounter for screening mammogram for malignant neoplasm of breast: Secondary | ICD-10-CM

## 2024-01-12 ENCOUNTER — Ambulatory Visit
Admission: RE | Admit: 2024-01-12 | Discharge: 2024-01-12 | Disposition: A | Discharge status: Home / Self Care | Source: Ambulatory Visit | Attending: Family Medicine | Admitting: Family Medicine

## 2024-01-12 DIAGNOSIS — Z1231 Encounter for screening mammogram for malignant neoplasm of breast: Secondary | ICD-10-CM
